# Patient Record
Sex: Female | Born: 2000
Health system: Southern US, Community
[De-identification: ages and names within clinical notes are randomized; demographics above are authoritative.]

## PROBLEM LIST (undated history)

## (undated) DIAGNOSIS — R519 Headache, unspecified: Secondary | ICD-10-CM

## (undated) DIAGNOSIS — R51 Headache: Secondary | ICD-10-CM

## (undated) DIAGNOSIS — J45909 Unspecified asthma, uncomplicated: Secondary | ICD-10-CM

## (undated) HISTORY — DX: Headache: R51

## (undated) HISTORY — DX: Headache, unspecified: R51.9

---
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
## 2022-08-14 ENCOUNTER — Other Ambulatory Visit: Payer: Self-pay | Admitting: Family Medicine

## 2022-08-14 DIAGNOSIS — Z30016 Encounter for initial prescription of transdermal patch hormonal contraceptive device: Secondary | ICD-10-CM

## 2022-08-25 ENCOUNTER — Encounter: Payer: Self-pay | Admitting: Family Medicine

## 2022-08-25 ENCOUNTER — Other Ambulatory Visit (HOSPITAL_BASED_OUTPATIENT_CLINIC_OR_DEPARTMENT_OTHER): Payer: Self-pay

## 2022-08-25 ENCOUNTER — Ambulatory Visit (INDEPENDENT_AMBULATORY_CARE_PROVIDER_SITE_OTHER): Payer: Self-pay | Admitting: Family Medicine

## 2022-08-25 VITALS — BP 128/80 | HR 83 | Temp 98.9°F | Ht 64.96 in | Wt 163.2 lb

## 2022-08-25 DIAGNOSIS — Z30016 Encounter for initial prescription of transdermal patch hormonal contraceptive device: Secondary | ICD-10-CM

## 2022-08-25 DIAGNOSIS — Z113 Encounter for screening for infections with a predominantly sexual mode of transmission: Secondary | ICD-10-CM

## 2022-08-25 DIAGNOSIS — Z1159 Encounter for screening for other viral diseases: Secondary | ICD-10-CM

## 2022-08-25 DIAGNOSIS — Z0000 Encounter for general adult medical examination without abnormal findings: Secondary | ICD-10-CM

## 2022-08-25 DIAGNOSIS — E049 Nontoxic goiter, unspecified: Secondary | ICD-10-CM

## 2022-08-25 DIAGNOSIS — Z23 Encounter for immunization: Secondary | ICD-10-CM

## 2022-08-25 LAB — LIPID PANEL
Cholesterol: 163 mg/dL (ref 0–200)
HDL: 42.1 mg/dL (ref >39.00)
LDL Cholesterol: 97 mg/dL (ref 0–99)
NonHDL: 120.98
Total CHOL/HDL Ratio: 4
Triglycerides: 121 mg/dL (ref 0.0–149.0)
VLDL: 24.2 mg/dL (ref 0.0–40.0)

## 2022-08-25 LAB — TSH: TSH: 4.18 u[IU]/mL (ref 0.35–5.50)

## 2022-08-25 LAB — CBC WITH DIFFERENTIAL/PLATELET
Basophils Absolute: 0 10*3/uL (ref 0.0–0.1)
Basophils Relative: 0.8 % (ref 0.0–3.0)
Eosinophils Absolute: 0.2 10*3/uL (ref 0.0–0.7)
Eosinophils Relative: 3.2 % (ref 0.0–5.0)
HCT: 40.3 % (ref 36.0–46.0)
Hemoglobin: 13.4 g/dL (ref 12.0–15.0)
Lymphocytes Relative: 44.9 % (ref 12.0–46.0)
Lymphs Abs: 2.5 10*3/uL (ref 0.7–4.0)
MCHC: 33.3 g/dL (ref 30.0–36.0)
MCV: 89.3 fl (ref 78.0–100.0)
Monocytes Absolute: 0.3 10*3/uL (ref 0.1–1.0)
Monocytes Relative: 5.3 % (ref 3.0–12.0)
Neutro Abs: 2.6 10*3/uL (ref 1.4–7.7)
Neutrophils Relative %: 45.8 % (ref 43.0–77.0)
Platelets: 363 10*3/uL (ref 150.0–400.0)
RBC: 4.51 Mil/uL (ref 3.87–5.11)
RDW: 13.5 % (ref 11.5–14.6)
WBC: 5.7 10*3/uL (ref 4.5–10.5)

## 2022-08-25 LAB — COMPREHENSIVE METABOLIC PANEL
ALT: 13 U/L (ref 0–35)
AST: 20 U/L (ref 0–37)
Albumin: 4.2 g/dL (ref 3.5–5.2)
Alkaline Phosphatase: 68 U/L (ref 39–117)
BUN: 12 mg/dL (ref 6–23)
CO2: 30 mEq/L (ref 19–32)
Calcium: 10.1 mg/dL (ref 8.4–10.5)
Chloride: 102 mEq/L (ref 96–112)
Creatinine, Ser: 0.92 mg/dL (ref 0.40–1.20)
GFR: 89.92 mL/min (ref >60.00)
Glucose, Bld: 87 mg/dL (ref 70–99)
Potassium: 3.8 mEq/L (ref 3.5–5.1)
Sodium: 140 mEq/L (ref 135–145)
Total Bilirubin: 0.4 mg/dL (ref 0.2–1.2)
Total Protein: 7.5 g/dL (ref 6.0–8.3)

## 2022-08-25 LAB — T4, FREE: Free T4: 0.71 ng/dL (ref 0.60–1.60)

## 2022-08-25 LAB — HEMOGLOBIN A1C: Hgb A1c MFr Bld: 5.6 % (ref 4.6–6.5)

## 2022-08-25 MED ORDER — NORELGESTROMIN-ETH ESTRADIOL 150-35 MCG/24HR TD PTWK
1.0000 | MEDICATED_PATCH | TRANSDERMAL | 5 refills | Status: DC
  Filled 2022-08-25: qty 9, 63d supply, fill #0

## 2022-08-25 MED ORDER — NORELGESTROMIN-ETH ESTRADIOL 150-35 MCG/24HR TD PTWK: 1.0000 | MEDICATED_PATCH | TRANSDERMAL | 5 refills | Status: DC

## 2022-08-25 NOTE — Patient Instructions (Addendum)
At today's visit he received the Gardasil HPV vaccine.  This is a series meaning you will need another dose two months from the date of your first vaccine.  This is around 10/24/2022.

## 2022-08-25 NOTE — Progress Notes (Signed)
Established Patient Office Visit   Subjective  Patient ID: Cindy Collins, female    DOB: Apr 25, 2001  Age: 22 y.o. MRN: [id number]  Chief Complaint  Patient presents with   Annual Exam    Patient is a 22 year old female with pmh sig for asthma, migraines who presents for CPE.  Pt states she has been doing well overall.  Requesting refill on birth control patches.  Denies issues with patches.  Reports no recent migraines.      ROS Negative unless stated above    Objective:     BP 128/80 (BP Location: Left Arm, Patient Position: Sitting, Cuff Size: Normal)   Pulse 83   Temp 98.9 F (37.2 C) (Oral)   Ht 5' 4.96" (1.65 m)   Wt 163 lb 3.2 oz (74 kg)   LMP 08/02/2022   SpO2 99%   BMI 27.19 kg/m    Physical Exam Constitutional:      Appearance: Normal appearance.  HENT:     Head: Normocephalic and atraumatic.     Right Ear: Tympanic membrane, ear canal and external ear normal.     Left Ear: Tympanic membrane, ear canal and external ear normal.     Nose: Nose normal.     Mouth/Throat:     Mouth: Mucous membranes are moist.     Pharynx: No oropharyngeal exudate or posterior oropharyngeal erythema.  Eyes:     General: No scleral icterus.    Extraocular Movements: Extraocular movements intact.     Conjunctiva/sclera: Conjunctivae normal.     Pupils: Pupils are equal, round, and reactive to light.  Neck:     Thyroid: No thyromegaly.  Cardiovascular:     Rate and Rhythm: Normal rate and regular rhythm.     Pulses: Normal pulses.     Heart sounds: Normal heart sounds. No murmur heard.    No friction rub.  Pulmonary:     Effort: Pulmonary effort is normal.     Breath sounds: Normal breath sounds. No wheezing, rhonchi or rales.  Abdominal:     General: Bowel sounds are normal.     Palpations: Abdomen is soft.     Tenderness: There is no abdominal tenderness.  Musculoskeletal:        General: No deformity. Normal range of motion.  Lymphadenopathy:      Cervical: No cervical adenopathy.  Skin:    General: Skin is warm and dry.     Findings: No lesion.  Neurological:     General: No focal deficit present.     Mental Status: She is alert and oriented to person, place, and time.  Psychiatric:        Mood and Affect: Mood normal.        Thought Content: Thought content normal.      No results found for any visits on 08/25/22.    Assessment & Plan:  Well adult exam -Anticipatory guidance given including wearing seatbelts, smoke detectors in the home, increasing physical activity, increasing p.o. intake of water and vegetables. -labs -pap due next yr at age 24. -mammogram and colonoscopy not indicated 2/2 age. -immunizations reviewed.  Chart updated, influenza vaccine given 04/09/2022.  Dose 1 of gardisil, HPV vaccine given this visit.  Dose 2 due in 2 months, and dose 3 in 6 months. -given handout -next CPE in 1 yr. -     Comprehensive metabolic panel -     CBC with Differential/Platelet  Encounter for initial prescription of transdermal patch hormonal contraceptive device -  Comprehensive metabolic panel -     Lipid panel -     Hemoglobin A1c -     CBC with Differential/Platelet -     Norelgestromin-Eth Estradiol; Place 1 patch onto the skin once a week.  Dispense: 9 patch; Refill: 5  Goiter -stable  -no nodules appreciated on exam -     Comprehensive metabolic panel -     TSH -     T4, free -     CBC with Differential/Platelet  Routine screening for STI (sexually transmitted infection) -     RPR -     HIV Antibody (routine testing w rflx) -     C. trachomatis/N. gonorrhoeae RNA -     Hepatitis C antibody  Need for HPV vaccination -dose one of gardisil given this visit.  Dose 2 due in 2 months. -     HPV 9-valent vaccine,Recombinant  Need for hep c screen       -     Hepatitis C antibody   Return in about 2 months (around 10/24/2022), or if symptoms worsen or fail to improve, for dose #2 of HPV vaccine.    Billie Ruddy, MD

## 2022-08-26 LAB — HIV ANTIBODY (ROUTINE TESTING W REFLEX): HIV 1&2 Ab, 4th Generation: NONREACTIVE

## 2022-08-26 LAB — C. TRACHOMATIS/N. GONORRHOEAE RNA
C. trachomatis RNA, TMA: NOT DETECTED
N. gonorrhoeae RNA, TMA: NOT DETECTED

## 2022-08-26 LAB — HEPATITIS C ANTIBODY: Hepatitis C Ab: NONREACTIVE

## 2022-08-26 LAB — RPR: RPR Ser Ql: NONREACTIVE

## 2023-07-30 ENCOUNTER — Encounter: Payer: Self-pay | Admitting: Family Medicine

## 2023-08-27 ENCOUNTER — Encounter: Payer: Self-pay | Admitting: Family Medicine

## 2023-08-27 ENCOUNTER — Ambulatory Visit: Payer: PRIVATE HEALTH INSURANCE | Admitting: Family Medicine

## 2023-08-27 VITALS — BP 102/68 | HR 72 | Temp 99.3°F | Ht 64.57 in | Wt 161.8 lb

## 2023-08-27 DIAGNOSIS — R220 Localized swelling, mass and lump, head: Secondary | ICD-10-CM

## 2023-08-27 DIAGNOSIS — R21 Rash and other nonspecific skin eruption: Secondary | ICD-10-CM

## 2023-08-27 DIAGNOSIS — Z113 Encounter for screening for infections with a predominantly sexual mode of transmission: Secondary | ICD-10-CM | POA: Diagnosis not present

## 2023-08-27 DIAGNOSIS — Z0000 Encounter for general adult medical examination without abnormal findings: Secondary | ICD-10-CM

## 2023-08-27 DIAGNOSIS — Z0001 Encounter for general adult medical examination with abnormal findings: Secondary | ICD-10-CM

## 2023-08-27 NOTE — Progress Notes (Signed)
 Established Patient Office Visit   Subjective  Patient ID: Cindy Collins, female    DOB: 2001/04/03  Age: 23 y.o. MRN: [id number]  Chief Complaint  Patient presents with   Rash    Rash around areola, dark color, patient denies any pain     Patient is a 23 year old female seen for CPE and concern.  Pt noticed a change in color of skin of R breast nest to areola.  Present x 2 wks.  Starting to go away.  Has picture on phone.  Area is not painful.  May have been mildly pruritic.  Changes lotions regularly, but no other rash noted.  Pt may have been sweating more.  Pt also notes a bump on her tongue.  Started a few wks ago, slightly larger, non painful and without bleeding.  Pt has wisdom tooth extraction scheduled in a few wks with an oral surgeon.    Patient Active Problem List   Diagnosis Date Noted   Migraine without aura and without status migrainosus, not intractable 01/09/2017   Tension headache 01/09/2017   Past Medical History:  Diagnosis Date   Asthma    Per father; Has not been given inhalers   Asthma    Headache    History reviewed. No pertinent surgical history. Social History   Tobacco Use   Smoking status: Never   Smokeless tobacco: Never  Substance Use Topics   Alcohol use: No   Drug use: No   Family History  Problem Relation Age of Onset   Diabetes Maternal Grandmother    Arthritis Maternal Grandmother    Miscarriages / Stillbirths Mother    Asthma Father    Stroke Maternal Grandfather    Birth defects Paternal Grandmother    Early death Paternal Grandmother    Hearing loss Paternal Grandfather    Allergies  Allergen Reactions   Bee Venom       ROS Negative unless stated above    Objective:     BP 102/68 (BP Location: Left Arm, Patient Position: Sitting, Cuff Size: Normal)   Pulse 72   Temp 99.3 F (37.4 C) (Oral)   Ht 5' 4.57" (1.64 m)   Wt 161 lb 12.8 oz (73.4 kg)   LMP 07/28/2023 (Approximate)   SpO2 95%   BMI 27.29 kg/m   BP Readings from Last 3 Encounters:  08/27/23 102/68  08/25/22 128/80  06/14/21 116/68   Wt Readings from Last 3 Encounters:  08/27/23 161 lb 12.8 oz (73.4 kg)  08/25/22 163 lb 3.2 oz (74 kg)  06/14/21 153 lb 12.8 oz (69.8 kg) (85%, Z= 1.04)*   * Growth percentiles are based on CDC (Girls, 2-20 Years) data.      Physical Exam Constitutional:      Appearance: Normal appearance.  HENT:     Head: Normocephalic and atraumatic.     Right Ear: Tympanic membrane, ear canal and external ear normal.     Left Ear: Tympanic membrane, ear canal and external ear normal.     Nose: Nose normal.     Mouth/Throat:     Mouth: Mucous membranes are moist.     Pharynx: No oropharyngeal exudate or posterior oropharyngeal erythema.  Eyes:     General: No scleral icterus.    Extraocular Movements: Extraocular movements intact.     Conjunctiva/sclera: Conjunctivae normal.     Pupils: Pupils are equal, round, and reactive to light.  Neck:     Thyroid: No thyromegaly.  Cardiovascular:  Rate and Rhythm: Normal rate and regular rhythm.     Pulses: Normal pulses.     Heart sounds: Normal heart sounds. No murmur heard.    No friction rub.  Pulmonary:     Effort: Pulmonary effort is normal.     Breath sounds: Normal breath sounds. No wheezing, rhonchi or rales.  Abdominal:     General: Bowel sounds are normal.     Palpations: Abdomen is soft.     Tenderness: There is no abdominal tenderness.  Musculoskeletal:        General: No deformity. Normal range of motion.  Lymphadenopathy:     Cervical: No cervical adenopathy.  Skin:    General: Skin is warm and dry.     Findings: No lesion.  Neurological:     General: No focal deficit present.     Mental Status: She is alert and oriented to person, place, and time.  Psychiatric:        Mood and Affect: Mood normal.        Thought Content: Thought content normal.       08/27/2023    3:54 PM  Depression screen PHQ 2/9  Decreased Interest 0   Down, Depressed, Hopeless 0  PHQ - 2 Score 0  Altered sleeping 1  Tired, decreased energy 1  Change in appetite 1  Feeling bad or failure about yourself  0  Trouble concentrating 0  Moving slowly or fidgety/restless 0  Suicidal thoughts 0  PHQ-9 Score 3  Difficult doing work/chores Not difficult at all      08/27/2023    3:54 PM  GAD 7 : Generalized Anxiety Score  Nervous, Anxious, on Edge 0  Control/stop worrying 0  Worry too much - different things 1  Trouble relaxing 0  Restless 0  Easily annoyed or irritable 0  Afraid - awful might happen 0  Total GAD 7 Score 1  Anxiety Difficulty Not difficult at all   No results found for any visits on 08/27/23.    Assessment & Plan:  Well adult exam -age appropriate health screenings discussed.  Pap encouraged as never done.  Pt wishes to schedule later. -labs -immunizations reviewed. -next CPE in 1 yr. -     CBC with Differential/Platelet; Future -     TSH; Future -     T4, free; Future -     Hemoglobin A1c; Future -     Comprehensive metabolic panel; Future  Nodule of tongue -nodule on R lateral tongue  -Fairly benign appearance however bxp/removal recommended.  Pt to contact oral surgeon to see if procedure can be done at time of wisdom tooth extraction.  Rash -resolving.  Picture of initial appearance viewed on pt's phone. -discussed possible causes of discoloration including fungal infection, dermatitis from using new products, etc. -ok to use OTC antifungal. -continue to monitor  Routine screening for STI (sexually transmitted infection) -     HIV Antibody (routine testing w rflx); Future -     RPR; Future   Return if symptoms worsen or fail to improve.   Deeann Saint, MD

## 2023-08-27 NOTE — Patient Instructions (Signed)
 Consider setting up Pap.

## 2023-08-28 ENCOUNTER — Other Ambulatory Visit: Payer: Self-pay | Admitting: Family Medicine

## 2023-08-28 ENCOUNTER — Encounter: Payer: Self-pay | Admitting: Family Medicine

## 2023-08-28 ENCOUNTER — Other Ambulatory Visit (HOSPITAL_BASED_OUTPATIENT_CLINIC_OR_DEPARTMENT_OTHER): Payer: Self-pay

## 2023-08-28 DIAGNOSIS — Z30016 Encounter for initial prescription of transdermal patch hormonal contraceptive device: Secondary | ICD-10-CM

## 2023-08-28 LAB — CBC WITH DIFFERENTIAL/PLATELET
Basophils Absolute: 0.1 10*3/uL (ref 0.0–0.1)
Basophils Relative: 1.1 % (ref 0.0–3.0)
Eosinophils Absolute: 0.1 10*3/uL (ref 0.0–0.7)
Eosinophils Relative: 1.4 % (ref 0.0–5.0)
HCT: 36.7 % (ref 36.0–46.0)
Hemoglobin: 12.2 g/dL (ref 12.0–15.0)
Lymphocytes Relative: 36.5 % (ref 12.0–46.0)
Lymphs Abs: 3.2 10*3/uL (ref 0.7–4.0)
MCHC: 33.3 g/dL (ref 30.0–36.0)
MCV: 90.2 fL (ref 78.0–100.0)
Monocytes Absolute: 0.3 10*3/uL (ref 0.1–1.0)
Monocytes Relative: 3.8 % (ref 3.0–12.0)
Neutro Abs: 4.9 10*3/uL (ref 1.4–7.7)
Neutrophils Relative %: 57.2 % (ref 43.0–77.0)
Platelets: 326 10*3/uL (ref 150.0–400.0)
RBC: 4.07 Mil/uL (ref 3.87–5.11)
RDW: 13.6 % (ref 11.5–15.5)
WBC: 8.7 10*3/uL (ref 4.0–10.5)

## 2023-08-28 LAB — COMPREHENSIVE METABOLIC PANEL
ALT: 8 U/L (ref 0–35)
AST: 17 U/L (ref 0–37)
Albumin: 4.4 g/dL (ref 3.5–5.2)
Alkaline Phosphatase: 47 U/L (ref 39–117)
BUN: 12 mg/dL (ref 6–23)
CO2: 26 meq/L (ref 19–32)
Calcium: 9.8 mg/dL (ref 8.4–10.5)
Chloride: 102 meq/L (ref 96–112)
Creatinine, Ser: 0.78 mg/dL (ref 0.40–1.20)
GFR: 108.85 mL/min (ref >60.00)
Glucose, Bld: 81 mg/dL (ref 70–99)
Potassium: 3.6 meq/L (ref 3.5–5.1)
Sodium: 137 meq/L (ref 135–145)
Total Bilirubin: 0.2 mg/dL (ref 0.2–1.2)
Total Protein: 7.7 g/dL (ref 6.0–8.3)

## 2023-08-28 LAB — HIV ANTIBODY (ROUTINE TESTING W REFLEX): HIV 1&2 Ab, 4th Generation: NONREACTIVE

## 2023-08-28 LAB — T4, FREE: Free T4: 0.69 ng/dL (ref 0.60–1.60)

## 2023-08-28 LAB — HEMOGLOBIN A1C: Hgb A1c MFr Bld: 5.5 % (ref 4.6–6.5)

## 2023-08-28 LAB — TSH: TSH: 3.08 u[IU]/mL (ref 0.35–5.50)

## 2023-08-28 LAB — RPR: RPR Ser Ql: NONREACTIVE

## 2023-09-01 ENCOUNTER — Other Ambulatory Visit (HOSPITAL_BASED_OUTPATIENT_CLINIC_OR_DEPARTMENT_OTHER): Payer: Self-pay

## 2023-09-07 ENCOUNTER — Telehealth: Payer: Self-pay

## 2023-09-07 ENCOUNTER — Other Ambulatory Visit (HOSPITAL_BASED_OUTPATIENT_CLINIC_OR_DEPARTMENT_OTHER): Payer: Self-pay

## 2023-09-07 DIAGNOSIS — Z30016 Encounter for initial prescription of transdermal patch hormonal contraceptive device: Secondary | ICD-10-CM

## 2023-09-07 MED ORDER — NORELGESTROMIN-ETH ESTRADIOL 150-35 MCG/24HR TD PTWK: 1.0000 | MEDICATED_PATCH | TRANSDERMAL | 5 refills | Status: AC

## 2023-09-07 NOTE — Addendum Note (Signed)
 Addended by: Philipp Deputy A on: 09/07/2023 02:49 PM   Modules accepted: Orders

## 2023-09-07 NOTE — Telephone Encounter (Signed)
 Copied from CRM [id number]. Topic: Clinical - Medication Question >> Sep 07, 2023  2:37 PM [place] J wrote: Reason for CRM: Pharmacy is stating that there are no more refills on norelgestromin-ethinyl estradiol Burr Medico) for the patient. Patient tried to contact us for a refill but it was denied as well. Patient would like to know what to do from this point.

## 2023-09-07 NOTE — Telephone Encounter (Signed)
 Medication has ben sent over to the pharmacy

## 2023-09-18 ENCOUNTER — Other Ambulatory Visit: Payer: Self-pay | Admitting: Family Medicine

## 2023-09-18 DIAGNOSIS — Z30016 Encounter for initial prescription of transdermal patch hormonal contraceptive device: Secondary | ICD-10-CM

## 2023-09-18 NOTE — Telephone Encounter (Signed)
 Copied from CRM [id number]. Topic: Clinical - Prescription Issue >> Sep 18, 2023 11:55 AM Marica Otter wrote: Reason for CRM: Patients mom is calling to have the prescription for norelgestromin-ethinyl estradiol Burr Medico) 150-35 MCG/24HR transdermal patch sent to the [facility] [employer] on file, thanks.   Blanchie Dessert [phone]

## 2023-09-30 ENCOUNTER — Other Ambulatory Visit: Payer: Self-pay

## 2023-09-30 ENCOUNTER — Encounter (HOSPITAL_COMMUNITY): Payer: Self-pay

## 2023-09-30 ENCOUNTER — Emergency Department (HOSPITAL_COMMUNITY): Payer: PRIVATE HEALTH INSURANCE

## 2023-09-30 ENCOUNTER — Emergency Department (HOSPITAL_COMMUNITY)
Admission: EM | Admit: 2023-09-30 | Discharge: 2023-09-30 | Disposition: A | Discharge status: Home / Self Care | Payer: PRIVATE HEALTH INSURANCE

## 2023-09-30 DIAGNOSIS — R0600 Dyspnea, unspecified: Secondary | ICD-10-CM | POA: Diagnosis present

## 2023-09-30 DIAGNOSIS — J45909 Unspecified asthma, uncomplicated: Secondary | ICD-10-CM | POA: Diagnosis not present

## 2023-09-30 LAB — BASIC METABOLIC PANEL WITH GFR
Anion gap: 14 (ref 5–15)
BUN: 11 mg/dL (ref 6–20)
CO2: 20 mmol/L — ABNORMAL LOW (ref 22–32)
Calcium: 9.1 mg/dL (ref 8.9–10.3)
Chloride: 103 mmol/L (ref 98–111)
Creatinine, Ser: 0.78 mg/dL (ref 0.44–1.00)
GFR, Estimated: >60 mL/min (ref >60)
Glucose, Bld: 93 mg/dL (ref 70–99)
Potassium: 3.3 mmol/L — ABNORMAL LOW (ref 3.5–5.1)
Sodium: 137 mmol/L (ref 135–145)

## 2023-09-30 LAB — CBC
HCT: 36.9 % (ref 36.0–46.0)
Hemoglobin: 12.1 g/dL (ref 12.0–15.0)
MCH: 29.7 pg (ref 26.0–34.0)
MCHC: 32.8 g/dL (ref 30.0–36.0)
MCV: 90.7 fL (ref 80.0–100.0)
Platelets: 355 10*3/uL (ref 150–400)
RBC: 4.07 MIL/uL (ref 3.87–5.11)
RDW: 12.8 % (ref 11.5–15.5)
WBC: 9.1 10*3/uL (ref 4.0–10.5)
nRBC: 0 % (ref 0.0–0.2)

## 2023-09-30 LAB — D-DIMER, QUANTITATIVE: D-Dimer, Quant: 0.48 ug{FEU}/mL (ref 0.00–0.50)

## 2023-09-30 LAB — HCG, SERUM, QUALITATIVE: Preg, Serum: NEGATIVE

## 2023-09-30 LAB — TROPONIN I (HIGH SENSITIVITY): Troponin I (High Sensitivity): 2 ng/L (ref <18)

## 2023-09-30 MED ORDER — ALBUTEROL SULFATE HFA 108 (90 BASE) MCG/ACT IN AERS
2.0000 | INHALATION_SPRAY | RESPIRATORY_TRACT | Status: DC | PRN
  Administered 2023-09-30 (×2): 2 via RESPIRATORY_TRACT
  Filled 2023-09-30 (×2): qty 6.7

## 2023-09-30 MED ORDER — ALBUTEROL SULFATE HFA 108 (90 BASE) MCG/ACT IN AERS: 1.0000 | INHALATION_SPRAY | Freq: Four times a day (QID) | RESPIRATORY_TRACT | 0 refills | Status: AC | PRN

## 2023-09-30 NOTE — Discharge Instructions (Signed)
 Your workup today was reassuring.  There are no signs of blood clots.  Your chest x-ray was clear.  Please follow-up with your OB/GYN and return to the ER for worsening symptoms.

## 2023-09-30 NOTE — ED Provider Notes (Signed)
 [place] EMERGENCY DEPARTMENT AT [facility] Provider Note   CSN: [id number] Arrival date & time: 09/30/23  1647     History  Chief Complaint  Patient presents with   Shortness of Breath    Cindy Collins is a 23 y.o. female.  23 year old female with past medical history of asthma presenting to the emergency department today with cough and shortness of breath.  The patient states that this started relatively abruptly this afternoon.  She states that she did not have any inhalers.  She states that she did not really feel a lot of wheezing or hear a lot of wheezing but was having some difficulty breathing.  She is use a few puffs of the albuterol here and is feeling better.  The patient is on estrogen containing birth control patch.  She denies any hemoptysis.  Denies any chest pain.  She came to the ER today further evaluation.  She denies any associated fevers or chills.  She did admit to smoking's marijuana earlier today.  Her initial heart rate at triage was very elevated.  Reports he is starting to feel better now.   Shortness of Breath      Home Medications Prior to Admission medications   Medication Sig Start Date End Date Taking? Authorizing Provider  albuterol (VENTOLIN HFA) 108 (90 Base) MCG/ACT inhaler Inhale 1-2 puffs into the lungs every 6 (six) hours as needed for wheezing or shortness of breath. 09/30/23  Yes Durwin Glaze, MD  albuterol (PROVENTIL HFA;VENTOLIN HFA) 108 (90 Base) MCG/ACT inhaler Inhale 1-2 puffs into the lungs every 4 (four) hours as needed for wheezing. 11/09/17   Campbell Riches, NP  loratadine (CLARITIN) 10 MG tablet Take 10 mg by mouth daily.    [provider]  norelgestromin-ethinyl estradiol Burr Medico) 150-35 MCG/24HR transdermal patch Place 1 patch onto the skin once a week. 09/07/23   Deeann Saint, MD  SUMAtriptan (IMITREX) 25 MG tablet Take one tablet by mouth.  May repeat dose in 2 hours if headache persists or  recurs. 11/14/20   Deeann Saint, MD      Allergies    Bee venom    Review of Systems   Review of Systems  Respiratory:  Positive for shortness of breath.   All other systems reviewed and are negative.   Physical Exam Updated Vital Signs BP 124/80 (BP Location: Right Arm)   Pulse 98   Temp 98.2 F (36.8 C) (Oral)   Resp 18   Ht 5' 4.5" (1.638 m)   Wt 74 kg   LMP 09/30/2023 (Exact Date)   SpO2 100%   BMI 27.57 kg/m  Physical Exam Vitals and nursing note reviewed.   Gen: NAD Eyes: PERRL, EOMI HEENT: no oropharyngeal swelling Neck: trachea midline Resp: clear to auscultation bilaterally Card: RRR, no murmurs, rubs, or gallops Abd: nontender, nondistended Extremities: no calf tenderness, no edema Vascular: 2+ radial pulses bilaterally, 2+ DP pulses bilaterally Skin: no rashes Psyc: acting appropriately   ED Results / Procedures / Treatments   Labs (all labs ordered are listed, but only abnormal results are displayed) Labs Reviewed  BASIC METABOLIC PANEL WITH GFR - Abnormal; Notable for the following components:      Result Value   Potassium 3.3 (*)    CO2 20 (*)    All other components within normal limits  CBC  D-DIMER, QUANTITATIVE  HCG, SERUM, QUALITATIVE  TROPONIN I (HIGH SENSITIVITY)    EKG EKG Interpretation Date/Time:  Wednesday September 30 2023 17:12:42 EDT Ventricular Rate:  133 PR Interval:  130 QRS Duration:  76 QT Interval:  318 QTC Calculation: 473 R Axis:   82  Text Interpretation: Sinus tachycardia Possible Left atrial enlargement T wave abnormality, consider inferior ischemia Abnormal ECG No previous ECGs available Confirmed by Beckey Downing [phone]) on 09/30/2023 5:14:49 PM  Radiology DG Chest 2 View Result Date: 09/30/2023 CLINICAL DATA:  Shortness of breath. EXAM: CHEST - 2 VIEW COMPARISON:  None Available. FINDINGS: The heart size and mediastinal contours are within normal limits. Both lungs are clear. The visualized skeletal structures  are unremarkable. IMPRESSION: Normal exam. Electronically Signed   By: Danae Orleans M.D.   On: 09/30/2023 18:36    Procedures Procedures    Medications Ordered in ED Medications  albuterol (VENTOLIN HFA) 108 (90 Base) MCG/ACT inhaler 2 puff (2 puffs Inhalation Given 09/30/23 1708)    ED Course/ Medical Decision Making/ A&P                                 Medical Decision Making 23 year old female with past medical history of asthma presenting to the emergency department today with shortness of breath and palpitations with elevated heart rate on arrival.  Her EKG does show some sinus tachycardia here.  I will further evaluate her here with basic labs to evaluate for anemia or electrolyte abnormalities.  Will also obtain a D-dimer to evaluate for pulmonary embolism as her heart rate was very elevated on arrival.  This does seem to be coming down.  She states she is feeling better after using the inhaler.  She does not have any significant wheezing here on exam but she has used 4 puffs of the albuterol now.  I will reevaluate for ultimate disposition.  Think that if her workup including her chest x-ray is unremarkable she may be discharged.  The patient's labs here are reassuring.  Chest x-ray is clear.  Her heart rate did improve to normal.  She is discharged with return precautions.  Amount and/or Complexity of Data Reviewed Labs: ordered. Radiology: ordered.  Risk Prescription drug management.           Final Clinical Impression(s) / ED Diagnoses Final diagnoses:  Dyspnea, unspecified type    Rx / DC Orders ED Discharge Orders          Ordered    albuterol (VENTOLIN HFA) 108 (90 Base) MCG/ACT inhaler  Every 6 hours PRN        09/30/23 2203              Durwin Glaze, MD 09/30/23 2204

## 2023-09-30 NOTE — ED Triage Notes (Signed)
 Pt arrived via POV c/o SOB. Pt reports seasonal allergies and Hx of Asthma and reports she cannot locate her albuterol inhaler. Pt presents in NAD. Denies cough. Denies Chest pain.

## 2023-09-30 NOTE — ED Triage Notes (Signed)
 Pt reports taking daily OTC allergy medicine.

## 2023-09-30 NOTE — ED Provider Triage Note (Signed)
 Emergency Medicine Provider Triage Evaluation Note  Cindy Collins , a 23 y.o. female  was evaluated in triage.  Pt complains of wheezing and shortness of breath which occurred just prior to arrival, she was driving in her car when she had sudden onset of symptoms, she has been battling some seasonal allergies as well.  No chest pain, no fevers..  Review of Systems  Positive: Shortness of breath, wheezing, clear rhinorrhea Negative: Chest pain, fever  Physical Exam  BP 127/82 (BP Location: Right Arm)   Pulse (!) 162   Temp 98.2 F (36.8 C) (Temporal)   Resp 18   Ht 5' 4.5" (1.638 m)   Wt 74 kg   LMP 09/30/2023 (Exact Date)   SpO2 100%   BMI 27.57 kg/m  Gen:   Awake, no distress   Resp:  Normal effort  MSK:   Moves extremities without difficulty  Other:   Medical Decision Making  Medically screening exam initiated at 6:02 PM.  Appropriate orders placed.  Cindy Mathilde Mcwherter was informed that the remainder of the evaluation will be completed by another provider, this initial triage assessment does not replace that evaluation, and the importance of remaining in the ED until their evaluation is complete.     Burgess Amor, PA-C 09/30/23 [id number]

## 2023-12-12 ENCOUNTER — Encounter: Payer: Self-pay | Admitting: Family Medicine

## 2023-12-14 ENCOUNTER — Ambulatory Visit: Payer: PRIVATE HEALTH INSURANCE | Admitting: Family Medicine

## 2023-12-14 VITALS — BP 120/78 | HR 79 | Temp 98.4°F | Resp 18 | Ht 64.5 in | Wt 161.4 lb

## 2023-12-14 DIAGNOSIS — Z111 Encounter for screening for respiratory tuberculosis: Secondary | ICD-10-CM | POA: Diagnosis not present

## 2023-12-14 DIAGNOSIS — Z0184 Encounter for antibody response examination: Secondary | ICD-10-CM | POA: Diagnosis not present

## 2023-12-14 DIAGNOSIS — Z23 Encounter for immunization: Secondary | ICD-10-CM

## 2023-12-14 DIAGNOSIS — Z029 Encounter for administrative examinations, unspecified: Secondary | ICD-10-CM

## 2023-12-14 NOTE — Progress Notes (Signed)
 Established Patient Office Visit   Subjective  Patient ID: Cindy Collins, female    DOB: [birth year]  Age: 23 y.o. MRN: [id number]  Chief Complaint  Patient presents with   Medical Management of Chronic Issues    Nursing school forms and labs      Patient is a 23 year old female seen for administrative encounter/testing.  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  Program requires rubella and varicella titer as well as evidence of MMR immunizations and QuantiFERON gold TB test annually.    Patient Active Problem List   Diagnosis Date Noted   Migraine without aura and without status migrainosus, not intractable 01/09/2017   Tension headache 01/09/2017   Past Medical History:  Diagnosis Date   Asthma    Per father; Has not been given inhalers   Asthma    Headache    History reviewed. No pertinent surgical history. Social History   Tobacco Use   Smoking status: Never   Smokeless tobacco: Never  Vaping Use   Vaping status: Some Days  Substance Use Topics   Alcohol use: No   Drug use: Yes    Types: Marijuana   Family History  Problem Relation Age of Onset   Diabetes Maternal Grandmother    Arthritis Maternal Grandmother    Miscarriages / Stillbirths Mother    Asthma Father    Stroke Maternal Grandfather    Birth defects Paternal Grandmother    Early death Paternal Grandmother    Hearing loss Paternal Grandfather    Allergies  Allergen Reactions   Bee Venom     ROS Negative unless stated above    Objective:     BP 120/78 (BP Location: Right Arm, Patient Position: Sitting, Cuff Size: Normal)   Pulse 79   Temp 98.4 F (36.9 C) (Oral)   Resp 18   Ht 5' 4.5 (1.638 m)   Wt 161 lb 6.4 oz (73.2 kg)   LMP 12/01/2023 (Approximate)   SpO2 97%   BMI 27.28 kg/m  BP Readings from Last 3 Encounters:  12/14/23 120/78  09/30/23 124/80  08/27/23 102/68   Wt Readings from Last 3 Encounters:  12/14/23 161 lb 6.4 oz (73.2  kg)  09/30/23 163 lb 2.3 oz (74 kg)  08/27/23 161 lb 12.8 oz (73.4 kg)   Hearing Screening   500Hz  1000Hz  2000Hz  4000Hz   Right ear Pass Pass Pass Pass  Left ear Pass Pass Pass Pass   Vision Screening   Right eye Left eye Both eyes  Without correction     With correction 20/20 20/25 20/15      Physical Exam Constitutional:      General: She is not in acute distress.    Appearance: Normal appearance.  HENT:     Head: Normocephalic and atraumatic.     Nose: Nose normal.     Mouth/Throat:     Mouth: Mucous membranes are moist.   Cardiovascular:     Rate and Rhythm: Normal rate and regular rhythm.     Heart sounds: Normal heart sounds. No murmur heard.    No gallop.  Pulmonary:     Effort: Pulmonary effort is normal. No respiratory distress.     Breath sounds: Normal breath sounds. No wheezing, rhonchi or rales.   Skin:    General: Skin is warm and dry.   Neurological:     Mental Status: She is alert and oriented to person, place, and time.        08/27/2023  3:54 PM  Depression screen PHQ 2/9  Decreased Interest 0  Down, Depressed, Hopeless 0  PHQ - 2 Score 0  Altered sleeping 1  Tired, decreased energy 1  Change in appetite 1  Feeling bad or failure about yourself  0  Trouble concentrating 0  Moving slowly or fidgety/restless 0  Suicidal thoughts 0  PHQ-9 Score 3  Difficult doing work/chores Not difficult at all      08/27/2023    3:54 PM  GAD 7 : Generalized Anxiety Score  Nervous, Anxious, on Edge 0  Control/stop worrying 0  Worry too much - different things 1  Trouble relaxing 0  Restless 0  Easily annoyed or irritable 0  Afraid - awful might happen 0  Total GAD 7 Score 1  Anxiety Difficulty Not difficult at all     No results found for any visits on 12/14/23.    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  Forms completed with the exception of requested lab results.  Will finish form when available.  Pt given a copy of immunization record.  Return if symptoms worsen or fail to improve.   Viola Greulich, MD

## 2023-12-16 ENCOUNTER — Ambulatory Visit: Payer: Self-pay | Admitting: Family Medicine

## 2023-12-17 LAB — QUANTIFERON-TB GOLD PLUS
Mitogen-NIL: >10 [IU]/mL
NIL: 0.02 [IU]/mL
QuantiFERON-TB Gold Plus: NEGATIVE
TB1-NIL: 0.01 [IU]/mL
TB2-NIL: 0.01 [IU]/mL

## 2023-12-17 LAB — MEASLES/MUMPS/RUBELLA IMMUNITY
Mumps IgG: 44.3 [AU]/ml
Rubella: 8.98 {index}
Rubeola IgG: >300 [AU]/ml

## 2023-12-17 LAB — VARICELLA ZOSTER ANTIBODY, IGG: Varicella IgG: 3.46 {s_co_ratio}

## 2023-12-22 NOTE — Telephone Encounter (Signed)
 Patient is aware that her paperwork is ready for pickup at the front desk

## 2024-01-20 ENCOUNTER — Encounter: Payer: Self-pay | Admitting: Family Medicine

## 2024-01-20 ENCOUNTER — Other Ambulatory Visit (HOSPITAL_COMMUNITY)
Admission: RE | Admit: 2024-01-20 | Discharge: 2024-01-20 | Disposition: A | Discharge status: Home / Self Care | Payer: PRIVATE HEALTH INSURANCE | Source: Ambulatory Visit | Attending: Family Medicine | Admitting: Family Medicine

## 2024-01-20 ENCOUNTER — Ambulatory Visit: Payer: PRIVATE HEALTH INSURANCE | Admitting: Family Medicine

## 2024-01-20 VITALS — BP 108/70 | HR 76 | Temp 98.2°F | Ht 64.5 in | Wt 161.0 lb

## 2024-01-20 DIAGNOSIS — N898 Other specified noninflammatory disorders of vagina: Secondary | ICD-10-CM | POA: Diagnosis present

## 2024-01-20 DIAGNOSIS — N3001 Acute cystitis with hematuria: Secondary | ICD-10-CM | POA: Diagnosis not present

## 2024-01-20 DIAGNOSIS — Z113 Encounter for screening for infections with a predominantly sexual mode of transmission: Secondary | ICD-10-CM

## 2024-01-20 DIAGNOSIS — R300 Dysuria: Secondary | ICD-10-CM | POA: Diagnosis not present

## 2024-01-20 LAB — POC URINALSYSI DIPSTICK (AUTOMATED)
Bilirubin, UA: NEGATIVE
Blood, UA: POSITIVE
Glucose, UA: NEGATIVE
Ketones, UA: POSITIVE
Leukocytes, UA: NEGATIVE
Nitrite, UA: NEGATIVE
Protein, UA: NEGATIVE
Spec Grav, UA: 1.025 (ref 1.010–1.025)
Urobilinogen, UA: 0.2 U/dL
pH, UA: 6 (ref 5.0–8.0)

## 2024-01-20 MED ORDER — NITROFURANTOIN MONOHYD MACRO 100 MG PO CAPS: 100.0000 mg | ORAL_CAPSULE | Freq: Two times a day (BID) | ORAL | 0 refills | Status: AC

## 2024-01-20 NOTE — Progress Notes (Signed)
 Established Patient Office Visit   Subjective  Patient ID: Cindy Collins, female    DOB: 01-13-2001  Age: 23 y.o. MRN: [id number]  Chief Complaint  Patient presents with   Medical Management of Chronic Issues    Patient came in today for Blood in urine, odor, low abdominal pain, and Frequency, started 4 days ago , and STD testing      Pt is a 23 year old female seen for acute concern.  Endorses abdominal pain, malodorous urine, blood in urine and urinary frequency x 4 days.  Patient also notes vaginal discharge.  Unsure if blood in urine 2/2 menses starting though it did occur at the beginning of July 8 or 9.  Patient denies back pain, fever, chills, nausea, vomiting.  Also endorses lower abdominal cramping.  Drinking more water since symptoms started.  Would like STI testing.    Patient Active Problem List   Diagnosis Date Noted   Migraine without aura and without status migrainosus, not intractable 01/09/2017   Tension headache 01/09/2017   Past Medical History:  Diagnosis Date   Asthma    Per father; Has not been given inhalers   Asthma    Headache    History reviewed. No pertinent surgical history. Social History   Tobacco Use   Smoking status: Never   Smokeless tobacco: Never  Vaping Use   Vaping status: Some Days  Substance Use Topics   Alcohol use: No   Drug use: Yes    Types: Marijuana   Family History  Problem Relation Age of Onset   Diabetes Maternal Grandmother    Arthritis Maternal Grandmother    Miscarriages / Stillbirths Mother    Asthma Father    Stroke Maternal Grandfather    Birth defects Paternal Grandmother    Early death Paternal Grandmother    Hearing loss Paternal Grandfather    Allergies  Allergen Reactions   Bee Venom     ROS Negative unless stated above    Objective:     BP 108/70 (BP Location: Left Arm, Patient Position: Sitting, Cuff Size: Normal)   Pulse 76   Temp 98.2 F (36.8 C) (Oral)   Ht 5' 4.5 (1.638 m)    Wt 161 lb (73 kg)   LMP 01/04/2024 (Approximate)   SpO2 99%   BMI 27.21 kg/m  BP Readings from Last 3 Encounters:  01/20/24 108/70  12/14/23 120/78  09/30/23 124/80   Wt Readings from Last 3 Encounters:  01/20/24 161 lb (73 kg)  12/14/23 161 lb 6.4 oz (73.2 kg)  09/30/23 163 lb 2.3 oz (74 kg)      Physical Exam Constitutional:      Appearance: Normal appearance.  HENT:     Head: Normocephalic and atraumatic.     Mouth/Throat:     Mouth: Mucous membranes are moist.  Cardiovascular:     Rate and Rhythm: Normal rate.  Pulmonary:     Effort: Pulmonary effort is normal.  Abdominal:     General: Bowel sounds are normal.     Tenderness: There is no guarding.  Genitourinary:    Comments: Aptima self swab collected. Musculoskeletal:        General: Normal range of motion.  Skin:    General: Skin is warm and dry.  Neurological:     Mental Status: She is alert and oriented to person, place, and time. Mental status is at baseline.        08/27/2023    3:54 PM  Depression screen  PHQ 2/9  Decreased Interest 0  Down, Depressed, Hopeless 0  PHQ - 2 Score 0  Altered sleeping 1  Tired, decreased energy 1  Change in appetite 1  Feeling bad or failure about yourself  0  Trouble concentrating 0  Moving slowly or fidgety/restless 0  Suicidal thoughts 0  PHQ-9 Score 3  Difficult doing work/chores Not difficult at all      08/27/2023    3:54 PM  GAD 7 : Generalized Anxiety Score  Nervous, Anxious, on Edge 0  Control/stop worrying 0  Worry too much - different things 1  Trouble relaxing 0  Restless 0  Easily annoyed or irritable 0  Afraid - awful might happen 0  Total GAD 7 Score 1  Anxiety Difficulty Not difficult at all     Results for orders placed or performed in visit on 01/20/24  POCT Urinalysis Dipstick (Automated)  Result Value Ref Range   Color, UA yellow    Clarity, UA clear    Glucose, UA Negative Negative   Bilirubin, UA neg    Ketones, UA pos     Spec Grav, UA 1.025 1.010 - 1.025   Blood, UA pos    pH, UA 6.0 5.0 - 8.0   Protein, UA Negative Negative   Urobilinogen, UA 0.2 0.2 or 1.0 E.U./dL   Nitrite, UA neg    Leukocytes, UA Negative Negative      Assessment & Plan:   Acute cystitis with hematuria -     Nitrofurantoin  Monohyd Macro; Take 1 capsule (100 mg total) by mouth 2 (two) times daily for 7 days.  Dispense: 14 capsule; Refill: 0  Routine screening for STI (sexually transmitted infection) -     Urine cytology ancillary only; Future -     Cervicovaginal ancillary only  Dysuria -     POCT Urinalysis Dipstick (Automated) -     Urine Culture; Future -     Urinalysis, Routine w reflex microscopic; Future -     Nitrofurantoin  Monohyd Macro; Take 1 capsule (100 mg total) by mouth 2 (two) times daily for 7 days.  Dispense: 14 capsule; Refill: 0  Vaginal discharge -     Cervicovaginal ancillary only  Patient with acute urinary symptoms.  POC UA with RBCs, ketones, SG 1.025.  Obtain UCX and micro.  Aptima self swab collected.  Start Macrobid  while awaiting results.  Discussed other supportive care including increasing p.o. intake of fluids.  Further recommendations if needed based on results.  Return if symptoms worsen or fail to improve.   Clotilda JONELLE Single, MD

## 2024-01-21 ENCOUNTER — Telehealth: Payer: Self-pay | Admitting: *Deleted

## 2024-01-21 LAB — URINE CULTURE
MICRO NUMBER:: 16735763
Result:: NO GROWTH
SPECIMEN QUALITY:: ADEQUATE

## 2024-01-21 LAB — URINALYSIS, ROUTINE W REFLEX MICROSCOPIC
Bilirubin Urine: NEGATIVE
Ketones, ur: 15 — AB
Leukocytes,Ua: NEGATIVE
Nitrite: NEGATIVE
Specific Gravity, Urine: 1.02 (ref 1.000–1.030)
Total Protein, Urine: NEGATIVE
Urine Glucose: NEGATIVE
Urobilinogen, UA: 0.2 (ref 0.0–1.0)
WBC, UA: NONE SEEN (ref 0–?)
pH: 6 (ref 5.0–8.0)

## 2024-01-21 NOTE — Telephone Encounter (Signed)
 Spoke with Lab,they are running swab only

## 2024-01-21 NOTE — Addendum Note (Signed)
 Addended by: MERCER KIRSCH R on: 01/21/2024 01:40 PM   Modules accepted: Orders

## 2024-01-21 NOTE — Telephone Encounter (Signed)
 Copied from CRM #[id number]. Topic: Clinical - Request for Lab/Test Order >> Jan 21, 2024 12:43 PM Viola F wrote: Reason for CRM: Gloria/Tamika from [facility] requesting call back from Dr. Mercer nurse - needs to specify which specimen is needed for patient, please call her at [phone].

## 2024-01-22 ENCOUNTER — Ambulatory Visit: Payer: Self-pay | Admitting: Family Medicine

## 2024-01-22 DIAGNOSIS — N760 Acute vaginitis: Secondary | ICD-10-CM

## 2024-01-22 LAB — CERVICOVAGINAL ANCILLARY ONLY
Bacterial Vaginitis (gardnerella): POSITIVE — AB
Candida Glabrata: NEGATIVE
Candida Vaginitis: NEGATIVE
Chlamydia: NEGATIVE
Comment: NEGATIVE
Comment: NEGATIVE
Comment: NEGATIVE
Comment: NEGATIVE
Comment: NEGATIVE
Comment: NORMAL
Neisseria Gonorrhea: NEGATIVE
Trichomonas: NEGATIVE

## 2024-01-22 MED ORDER — METRONIDAZOLE 500 MG PO TABS: 500.0000 mg | ORAL_TABLET | Freq: Two times a day (BID) | ORAL | 0 refills | Status: AC

## 2024-08-12 ENCOUNTER — Encounter: Payer: Self-pay | Admitting: Family Medicine

## 2024-08-31 ENCOUNTER — Encounter: Payer: Self-pay | Admitting: Family Medicine
